# Patient Record
Sex: Female | Born: 1980 | Race: White | Hispanic: No | Marital: Single | Smoking: Former smoker
Health system: Southern US, Community
[De-identification: ages and names within clinical notes are randomized; demographics above are authoritative.]

## PROBLEM LIST (undated history)

## (undated) DIAGNOSIS — K529 Noninfective gastroenteritis and colitis, unspecified: Secondary | ICD-10-CM

## (undated) DIAGNOSIS — N1330 Unspecified hydronephrosis: Secondary | ICD-10-CM

## (undated) DIAGNOSIS — K219 Gastro-esophageal reflux disease without esophagitis: Secondary | ICD-10-CM

## (undated) DIAGNOSIS — J45909 Unspecified asthma, uncomplicated: Secondary | ICD-10-CM

## (undated) DIAGNOSIS — N289 Disorder of kidney and ureter, unspecified: Secondary | ICD-10-CM

## (undated) DIAGNOSIS — E876 Hypokalemia: Secondary | ICD-10-CM

## (undated) DIAGNOSIS — K2970 Gastritis, unspecified, without bleeding: Secondary | ICD-10-CM

## (undated) DIAGNOSIS — N883 Incompetence of cervix uteri: Secondary | ICD-10-CM

## (undated) DIAGNOSIS — J310 Chronic rhinitis: Secondary | ICD-10-CM

## (undated) HISTORY — PX: CERVICAL CERCLAGE: SHX1329

## (undated) HISTORY — PX: OTHER SURGICAL HISTORY: SHX169

## (undated) HISTORY — PX: TUBAL LIGATION: SHX77

---

## 2010-01-25 ENCOUNTER — Inpatient Hospital Stay (HOSPITAL_COMMUNITY): Admission: AD | Admit: 2010-01-25 | Discharge: 2010-01-25 | Payer: Self-pay | Admitting: Family Medicine

## 2010-02-06 ENCOUNTER — Ambulatory Visit: Payer: Self-pay | Admitting: Obstetrics and Gynecology

## 2010-02-06 ENCOUNTER — Inpatient Hospital Stay (HOSPITAL_COMMUNITY): Admission: AD | Admit: 2010-02-06 | Discharge: 2010-02-06 | Payer: Self-pay | Admitting: Obstetrics & Gynecology

## 2010-05-20 ENCOUNTER — Emergency Department (HOSPITAL_COMMUNITY): Admission: EM | Admit: 2010-05-20 | Discharge: 2010-05-20 | Payer: Self-pay | Admitting: Emergency Medicine

## 2010-10-03 ENCOUNTER — Emergency Department (HOSPITAL_COMMUNITY)
Admission: EM | Admit: 2010-10-03 | Discharge: 2010-10-03 | Disposition: A | Discharge status: Home / Self Care | Payer: Medicaid Other | Attending: Emergency Medicine | Admitting: Emergency Medicine

## 2010-10-03 DIAGNOSIS — K029 Dental caries, unspecified: Secondary | ICD-10-CM | POA: Insufficient documentation

## 2010-10-03 DIAGNOSIS — K089 Disorder of teeth and supporting structures, unspecified: Secondary | ICD-10-CM | POA: Insufficient documentation

## 2010-10-03 DIAGNOSIS — K219 Gastro-esophageal reflux disease without esophagitis: Secondary | ICD-10-CM | POA: Insufficient documentation

## 2010-10-03 DIAGNOSIS — J45909 Unspecified asthma, uncomplicated: Secondary | ICD-10-CM | POA: Insufficient documentation

## 2010-11-11 LAB — URINALYSIS, ROUTINE W REFLEX MICROSCOPIC
Bilirubin Urine: NEGATIVE
Ketones, ur: NEGATIVE mg/dL
Nitrite: NEGATIVE
Specific Gravity, Urine: 1.007 (ref 1.005–1.030)
Urobilinogen, UA: 0.2 mg/dL (ref 0.0–1.0)

## 2010-11-11 LAB — COMPREHENSIVE METABOLIC PANEL
ALT: 16 U/L (ref 0–35)
Albumin: 4.3 g/dL (ref 3.5–5.2)
BUN: 2 mg/dL — ABNORMAL LOW (ref 6–23)
Calcium: 9.5 mg/dL (ref 8.4–10.5)
Chloride: 107 mEq/L (ref 96–112)
GFR calc Af Amer: >60 mL/min (ref >60)
GFR calc non Af Amer: >60 mL/min (ref >60)
Glucose, Bld: 89 mg/dL (ref 70–99)
Potassium: 3.7 mEq/L (ref 3.5–5.1)
Total Bilirubin: 0.6 mg/dL (ref 0.3–1.2)
Total Protein: 6.6 g/dL (ref 6.0–8.3)

## 2010-11-11 LAB — CBC
Hemoglobin: 11.6 g/dL — ABNORMAL LOW (ref 12.0–15.0)
MCHC: 33.7 g/dL (ref 30.0–36.0)
Platelets: 260 10*3/uL (ref 150–400)
RBC: 4.02 MIL/uL (ref 3.87–5.11)
RDW: 13.2 % (ref 11.5–15.5)

## 2010-11-11 LAB — DIFFERENTIAL
Eosinophils Relative: 1 % (ref 0–5)
Lymphocytes Relative: 12 % (ref 12–46)
Lymphs Abs: 0.9 10*3/uL (ref 0.7–4.0)
Monocytes Absolute: 0.3 10*3/uL (ref 0.1–1.0)
Monocytes Relative: 5 % (ref 3–12)
Neutrophils Relative %: 82 % — ABNORMAL HIGH (ref 43–77)

## 2010-11-11 LAB — URINE CULTURE
Colony Count: NO GROWTH
Culture  Setup Time: 201109221235
Culture: NO GROWTH

## 2010-11-11 LAB — URINE MICROSCOPIC-ADD ON

## 2010-11-15 LAB — URINALYSIS, ROUTINE W REFLEX MICROSCOPIC
Bilirubin Urine: NEGATIVE
Glucose, UA: NEGATIVE mg/dL
Ketones, ur: NEGATIVE mg/dL
Nitrite: NEGATIVE
Nitrite: NEGATIVE
Protein, ur: 100 mg/dL — AB
Specific Gravity, Urine: <1.005 — ABNORMAL LOW (ref 1.005–1.030)
Specific Gravity, Urine: 1.01 (ref 1.005–1.030)
Urobilinogen, UA: 0.2 mg/dL (ref 0.0–1.0)
pH: 6.5 (ref 5.0–8.0)
pH: 7 (ref 5.0–8.0)

## 2010-11-15 LAB — URINE CULTURE

## 2010-11-15 LAB — DIFFERENTIAL
Eosinophils Relative: 1 % (ref 0–5)
Monocytes Absolute: 0.6 10*3/uL (ref 0.1–1.0)
Neutro Abs: 9 10*3/uL — ABNORMAL HIGH (ref 1.7–7.7)

## 2010-11-15 LAB — URINE MICROSCOPIC-ADD ON

## 2010-11-15 LAB — WET PREP, GENITAL
Clue Cells Wet Prep HPF POC: NONE SEEN
Trich, Wet Prep: NONE SEEN
Trich, Wet Prep: NONE SEEN
Yeast Wet Prep HPF POC: NONE SEEN

## 2010-11-15 LAB — POCT PREGNANCY, URINE: Preg Test, Ur: NEGATIVE

## 2010-11-15 LAB — GC/CHLAMYDIA PROBE AMP, GENITAL: Chlamydia, DNA Probe: NEGATIVE

## 2010-11-15 LAB — CBC
MCHC: 35.2 g/dL (ref 30.0–36.0)
Platelets: 188 10*3/uL (ref 150–400)

## 2010-11-22 ENCOUNTER — Emergency Department (HOSPITAL_COMMUNITY)
Admission: EM | Admit: 2010-11-22 | Discharge: 2010-11-22 | Disposition: A | Discharge status: Home / Self Care | Payer: Self-pay | Attending: Emergency Medicine | Admitting: Emergency Medicine

## 2010-11-22 DIAGNOSIS — J309 Allergic rhinitis, unspecified: Secondary | ICD-10-CM | POA: Insufficient documentation

## 2010-11-22 DIAGNOSIS — R05 Cough: Secondary | ICD-10-CM | POA: Insufficient documentation

## 2010-11-22 DIAGNOSIS — I10 Essential (primary) hypertension: Secondary | ICD-10-CM | POA: Insufficient documentation

## 2010-11-22 DIAGNOSIS — J45909 Unspecified asthma, uncomplicated: Secondary | ICD-10-CM | POA: Insufficient documentation

## 2010-11-22 DIAGNOSIS — J3489 Other specified disorders of nose and nasal sinuses: Secondary | ICD-10-CM | POA: Insufficient documentation

## 2010-11-22 DIAGNOSIS — Z85118 Personal history of other malignant neoplasm of bronchus and lung: Secondary | ICD-10-CM | POA: Insufficient documentation

## 2010-11-22 DIAGNOSIS — R059 Cough, unspecified: Secondary | ICD-10-CM | POA: Insufficient documentation

## 2010-11-22 DIAGNOSIS — K219 Gastro-esophageal reflux disease without esophagitis: Secondary | ICD-10-CM | POA: Insufficient documentation

## 2011-01-28 ENCOUNTER — Emergency Department (HOSPITAL_COMMUNITY)
Admission: EM | Admit: 2011-01-28 | Discharge: 2011-01-28 | Disposition: A | Discharge status: Home / Self Care | Payer: Self-pay | Attending: Emergency Medicine | Admitting: Emergency Medicine

## 2011-01-28 DIAGNOSIS — J45909 Unspecified asthma, uncomplicated: Secondary | ICD-10-CM | POA: Insufficient documentation

## 2011-01-28 DIAGNOSIS — J029 Acute pharyngitis, unspecified: Secondary | ICD-10-CM | POA: Insufficient documentation

## 2011-01-28 DIAGNOSIS — K219 Gastro-esophageal reflux disease without esophagitis: Secondary | ICD-10-CM | POA: Insufficient documentation

## 2011-01-28 LAB — RAPID STREP SCREEN (MED CTR MEBANE ONLY): Streptococcus, Group A Screen (Direct): NEGATIVE

## 2011-05-09 ENCOUNTER — Inpatient Hospital Stay (INDEPENDENT_AMBULATORY_CARE_PROVIDER_SITE_OTHER)
Admission: RE | Admit: 2011-05-09 | Discharge: 2011-05-09 | Disposition: A | Discharge status: Home / Self Care | Payer: Self-pay | Source: Ambulatory Visit | Attending: Emergency Medicine | Admitting: Emergency Medicine

## 2011-05-09 DIAGNOSIS — K5289 Other specified noninfective gastroenteritis and colitis: Secondary | ICD-10-CM

## 2011-05-09 LAB — POCT I-STAT, CHEM 8
Calcium, Ion: 1.19 mmol/L (ref 1.12–1.32)
Chloride: 107 mEq/L (ref 96–112)
Creatinine, Ser: 0.6 mg/dL (ref 0.50–1.10)
Glucose, Bld: 98 mg/dL (ref 70–99)
Potassium: 4 mEq/L (ref 3.5–5.1)

## 2011-05-09 LAB — POCT URINALYSIS DIP (DEVICE)
Ketones, ur: NEGATIVE mg/dL
Protein, ur: NEGATIVE mg/dL
Specific Gravity, Urine: 1.02 (ref 1.005–1.030)
pH: 6 (ref 5.0–8.0)

## 2011-05-09 LAB — POCT PREGNANCY, URINE: Preg Test, Ur: NEGATIVE

## 2011-05-13 ENCOUNTER — Inpatient Hospital Stay (INDEPENDENT_AMBULATORY_CARE_PROVIDER_SITE_OTHER)
Admission: RE | Admit: 2011-05-13 | Discharge: 2011-05-13 | Disposition: A | Discharge status: Home / Self Care | Payer: Self-pay | Source: Ambulatory Visit | Attending: Family Medicine | Admitting: Family Medicine

## 2011-05-13 DIAGNOSIS — R197 Diarrhea, unspecified: Secondary | ICD-10-CM

## 2011-05-13 LAB — POCT I-STAT, CHEM 8
BUN: <3 mg/dL — ABNORMAL LOW (ref 6–23)
Calcium, Ion: 1.2 mmol/L (ref 1.12–1.32)
Chloride: 104 mEq/L (ref 96–112)
Creatinine, Ser: 0.6 mg/dL (ref 0.50–1.10)
Glucose, Bld: 97 mg/dL (ref 70–99)

## 2011-06-03 ENCOUNTER — Inpatient Hospital Stay (INDEPENDENT_AMBULATORY_CARE_PROVIDER_SITE_OTHER)
Admission: RE | Admit: 2011-06-03 | Discharge: 2011-06-03 | Disposition: A | Discharge status: Home / Self Care | Payer: Self-pay | Source: Ambulatory Visit | Attending: Family Medicine | Admitting: Family Medicine

## 2011-06-03 DIAGNOSIS — J029 Acute pharyngitis, unspecified: Secondary | ICD-10-CM

## 2011-06-03 DIAGNOSIS — J069 Acute upper respiratory infection, unspecified: Secondary | ICD-10-CM

## 2011-10-18 ENCOUNTER — Encounter (HOSPITAL_COMMUNITY): Payer: Self-pay | Admitting: *Deleted

## 2011-10-18 ENCOUNTER — Emergency Department (INDEPENDENT_AMBULATORY_CARE_PROVIDER_SITE_OTHER)
Admission: EM | Admit: 2011-10-18 | Discharge: 2011-10-18 | Disposition: A | Discharge status: Home / Self Care | Payer: Self-pay | Source: Home / Self Care | Attending: Family Medicine | Admitting: Family Medicine

## 2011-10-18 DIAGNOSIS — J310 Chronic rhinitis: Secondary | ICD-10-CM

## 2011-10-18 MED ORDER — ALBUTEROL SULFATE HFA 108 (90 BASE) MCG/ACT IN AERS: 2.0000 | INHALATION_SPRAY | RESPIRATORY_TRACT | Status: DC | PRN

## 2011-10-18 NOTE — ED Notes (Signed)
pT  HAS  COMPLAINTS  OF  ABOUT 1  WEEK  OF  COUGH  /  CONGESTION   WITH  STUFFY  NOSE    AND   SINUS  DRAINAGE  SYMPTOMS  X    1  WEEK    SHE  IS  AWAKE  AS  WELL AS  ALERT AND  ORIENTED  SPEAKING IN  COMPLETE  SENTANCES

## 2011-10-18 NOTE — ED Provider Notes (Signed)
History     CSN: [id number]  Arrival date & time 10/18/11  1509   First MD Initiated Contact with Patient 10/18/11 1624      Chief Complaint  Patient presents with  . Cough    (Consider location/radiation/quality/duration/timing/severity/associated sxs/prior treatment) HPI Comments: Mala presents for evaluation of nasal congestion, rhinorrhea, and cough over the last week. She denies any fever. She's been using her albuterol inhaler without much relief. She reports that she did have a sore throat that is now resolved.  Patient is a 31 y.o. female presenting with URI. The history is provided by the patient.  URI The primary symptoms include sore throat and cough. Primary symptoms do not include fever or wheezing. The current episode started more than 1 week ago. This is a new problem. The problem has been gradually improving.  The sore throat began more than 2 days ago. The sore throat has been resolved since its onset. The sore throat is mild in intensity. The sore throat is not accompanied by trouble swallowing.  Symptoms associated with the illness include sinus pressure, congestion and rhinorrhea. The following treatments were addressed: Acetaminophen was ineffective. A decongestant was ineffective.    History reviewed. No pertinent past medical history.  Past Surgical History  Procedure Date  . Btl     No family history on file.  History  Substance Use Topics  . Smoking status: Not on file  . Smokeless tobacco: Not on file  . Alcohol Use:     OB History    Grav Para Term Preterm Abortions TAB SAB Ect Mult Living                  Review of Systems  Constitutional: Negative.  Negative for fever.  HENT: Positive for congestion, sore throat, rhinorrhea and sinus pressure. Negative for trouble swallowing.   Eyes: Negative.   Respiratory: Positive for cough. Negative for wheezing.   Cardiovascular: Negative.   Gastrointestinal: Negative.   Genitourinary: Negative.     Musculoskeletal: Negative.   Skin: Negative.   Neurological: Negative.     Allergies  Review of patient's allergies indicates no known allergies.  Home Medications   Current Outpatient Rx  Name Route Sig Dispense Refill  . ALBUTEROL SULFATE HFA 108 (90 BASE) MCG/ACT IN AERS Inhalation Inhale 2 puffs into the lungs every 4 (four) hours as needed for wheezing or shortness of breath. 1 Inhaler 0    BP 132/64  Pulse 94  Temp(Src) 98.7 F (37.1 C) (Oral)  Resp 16  SpO2 100%  LMP 09/04/2011  Physical Exam  Nursing note and vitals reviewed. Constitutional: She is oriented to person, place, and time. She appears well-developed and well-nourished.  HENT:  Head: Normocephalic and atraumatic.  Right Ear: Tympanic membrane is retracted.  Left Ear: Tympanic membrane is retracted.  Mouth/Throat: Uvula is midline, oropharynx is clear and moist and mucous membranes are normal.  Eyes: EOM are normal.  Neck: Normal range of motion.  Pulmonary/Chest: Effort normal and breath sounds normal. She has no decreased breath sounds. She has no wheezes. She has no rhonchi.  Musculoskeletal: Normal range of motion.  Neurological: She is alert and oriented to person, place, and time.  Skin: Skin is warm and dry.  Psychiatric: Her behavior is normal.    ED Course  Procedures (including critical care time)  Labs Reviewed - No data to display No results found.   1. Rhinitis       MDM  Continue supportive care;  refilled albuterol inhaler        Richardo Priest, MD 10/18/11 1647

## 2011-10-18 NOTE — Discharge Instructions (Signed)
I recommend aggressive fever control with acetaminophen (Tylenol) and/or ibuprofen. You may use these together, alternating them every 4 hours, or individually, every 8 hours. For example, take acetaminophen 500 to 1000 mg at 12 noon, then 600 to 800 mg of ibuprofen at 4 pm, then acetaminophen at 8 pm, etc. Also, stay hydrated with clear liquids. Use your albuterol as needed and as directed. Return to care should your symptoms not improve, or worsen in any way.

## 2012-05-18 ENCOUNTER — Emergency Department (HOSPITAL_COMMUNITY)
Admission: EM | Admit: 2012-05-18 | Discharge: 2012-05-18 | Disposition: A | Discharge status: Home / Self Care | Payer: Self-pay | Attending: Emergency Medicine | Admitting: Emergency Medicine

## 2012-05-18 ENCOUNTER — Encounter (HOSPITAL_COMMUNITY): Payer: Self-pay

## 2012-05-18 DIAGNOSIS — J029 Acute pharyngitis, unspecified: Secondary | ICD-10-CM | POA: Insufficient documentation

## 2012-05-18 HISTORY — DX: Disorder of kidney and ureter, unspecified: N28.9

## 2012-05-18 HISTORY — DX: Unspecified hydronephrosis: N13.30

## 2012-05-18 HISTORY — DX: Incompetence of cervix uteri: N88.3

## 2012-05-18 LAB — RAPID STREP SCREEN (MED CTR MEBANE ONLY): Streptococcus, Group A Screen (Direct): NEGATIVE

## 2012-05-18 NOTE — ED Provider Notes (Signed)
History     CSN: [id number]  Arrival date & time 05/18/12  0804   First MD Initiated Contact with Patient 05/18/12 [phone]      Chief Complaint  Patient presents with  . Sore Throat    (Consider location/radiation/quality/duration/timing/severity/associated sxs/prior treatment) HPI  31 year old female presents complaining of sore throat. Pt reports for the past several weeks she has been experiencing throat soreness and irritation.  Sxs has been persistent, worse at night.  Does complain of increase irritating with swallowing but able to eat and drink.  Has been having dental pain and was seen by a dentist.  Sts she was placed on penicillin which she has been taking for the past 2 weeks while awaiting for dental extraction next week.  Pt denies fever, chills, ear pain, cp, sob.  Has moved from [place] to here and has been having seasonal allergy with sneezing and itchy eyes, but currently taking claritin for it.  No recent trauma, no abd pain, no rash.    History reviewed. No pertinent past medical history.  Past Surgical History  Procedure Date  . Btl     No family history on file.  History  Substance Use Topics  . Smoking status: Not on file  . Smokeless tobacco: Not on file  . Alcohol Use:     OB History    Grav Para Term Preterm Abortions TAB SAB Ect Mult Living                  Review of Systems  All other systems reviewed and are negative.    Allergies  Review of patient's allergies indicates no known allergies.  Home Medications   Current Outpatient Rx  Name Route Sig Dispense Refill  . ALBUTEROL SULFATE HFA 108 (90 BASE) MCG/ACT IN AERS Inhalation Inhale 2 puffs into the lungs every 4 (four) hours as needed for wheezing or shortness of breath. 1 Inhaler 0    BP 148/94  Pulse 99  Temp 97.3 F (36.3 C) (Oral)  Resp 20  SpO2 100%  Physical Exam  Nursing note and vitals reviewed. Constitutional: She appears well-developed and well-nourished. No  distress.  HENT:  Head: Atraumatic.  Right Ear: External ear normal.  Left Ear: External ear normal.  Nose: Nose normal.  Mouth/Throat: Oropharynx is clear and moist. No oropharyngeal exudate.       No tonsilar exudates or tonsilar enlargement noted.  Uvula midline.  No evidence of PTA or Ludwig's angina.  No voice changes, no trismus, no drooling.    Poor dentition throughout without evidence of visible abscess  Eyes: Conjunctivae normal are normal.  Neck: Normal range of motion. Neck supple.  Cardiovascular: Normal rate and regular rhythm.   Pulmonary/Chest: Effort normal and breath sounds normal. No respiratory distress. She has no wheezes. She exhibits no tenderness.  Abdominal: Bowel sounds are normal.  Lymphadenopathy:    She has no cervical adenopathy.  Neurological: She is alert.  Skin: Skin is warm.    ED Course  Procedures (including critical care time)   Labs Reviewed  RAPID STREP SCREEN   Results for orders placed during the hospital encounter of 05/18/12  RAPID STREP SCREEN      Component Value Range   Streptococcus, Group A Screen (Direct) NEGATIVE  NEGATIVE   No results found.  1. Sore throat  MDM  Pt with sore throat x 3 weeks.  No voice changes, no air way compromise, able to eat and drink, exam was remarkable only  for poor dentition and pt currently taking PCN for it.  She is scheduled to have dental appointment next week.  Will perform rapid strep test for further evaluation.  She is afebrile, nontoxic.      9:25 AM Strep test neg.  Pt able to tolerates PO.  Recommend f/u with dentist, and continue gargle with salt water.  Throat irritation can also be related to allergy. Pt will continue OTC allergy medication.  Nursing notes reviewed and considered in documentation  Previous records reviewed and considered   BP 148/94  Pulse 99  Temp 97.3 F (36.3 C) (Oral)  Resp 20  SpO2 100%  LMP 04/17/2012     Fayrene Helper, PA-C 05/18/12 [phone]

## 2012-05-18 NOTE — ED Provider Notes (Signed)
Medical screening examination/treatment/procedure(s) were performed by non-physician practitioner and as supervising physician I was immediately available for consultation/collaboration.   Ellana Kawa, MD 05/18/12 1538 

## 2012-05-18 NOTE — ED Notes (Addendum)
Pt c/o sore throat with puss pockets at the back of her throat for several weeks and problems swallowing. Pt states she has tried as many OTC remedies as possible. Pt is suppose to go to the dentist for a procedure Monday morning and is worried she won't be able to breathe for the procedure.  No fever to her knowledge. Has been taking allergy medicine.

## 2012-05-18 NOTE — ED Notes (Signed)
[place], PA at the bedside 

## 2012-06-28 IMAGING — CR DG CHEST 2V
2 series · 2 of 2 positions shown · non-contrast
Comparison: None.

CLINICAL DATA: Cough 2 weeks

CHEST - 2 VIEW

[w chest pa]
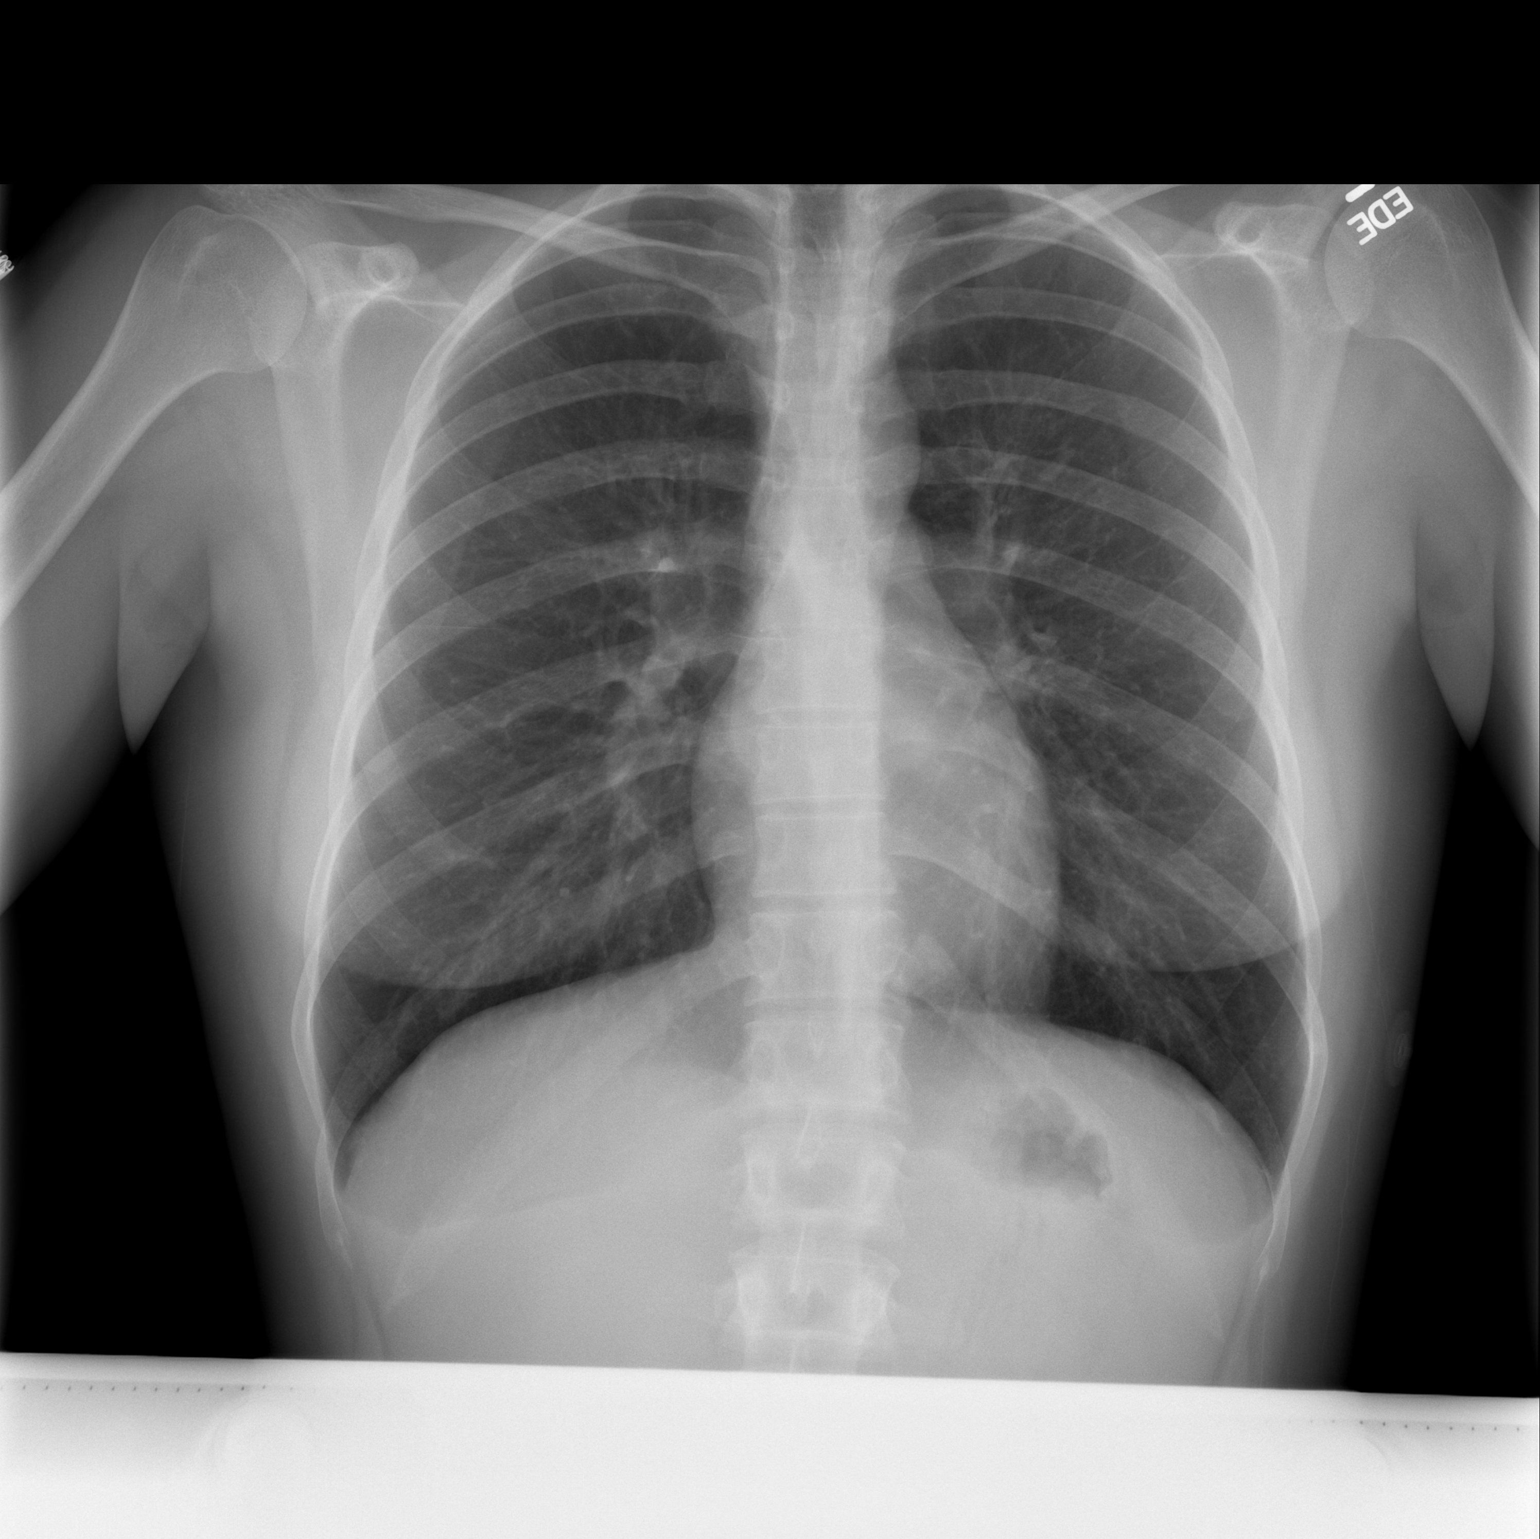

[w chest lat]
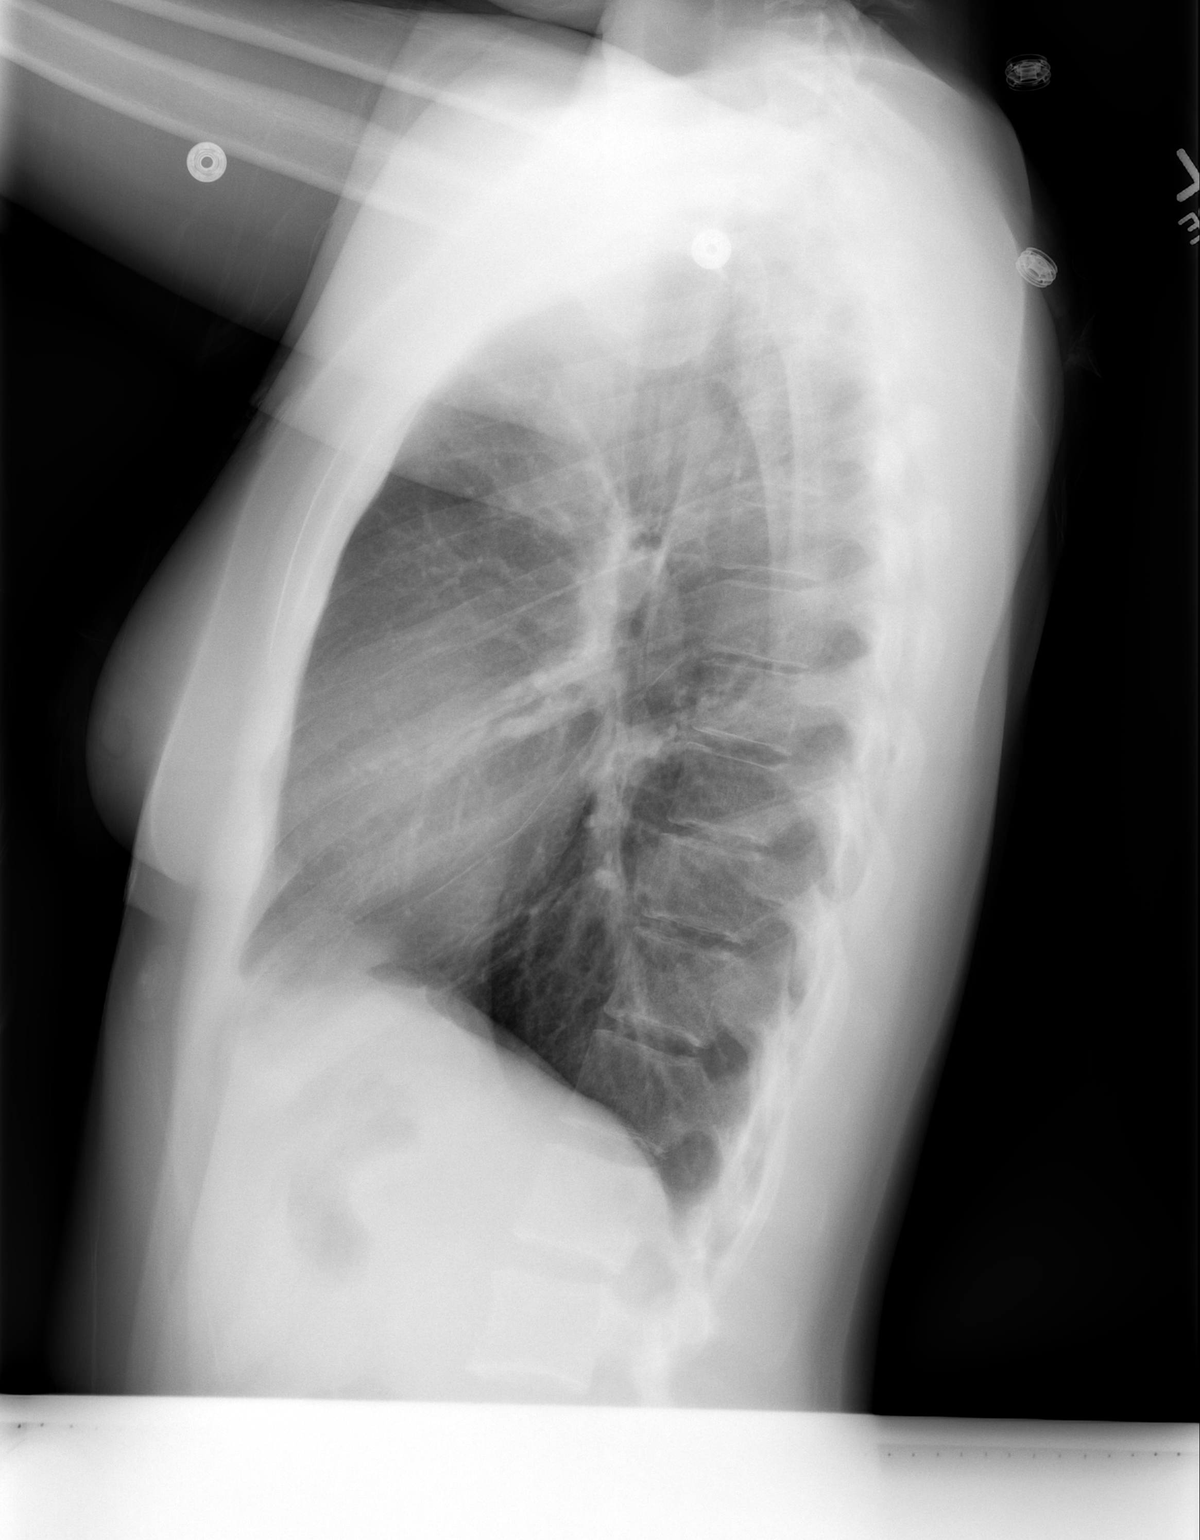

[2 of 2 positions shown; findings below may reference images not displayed]

FINDINGS: Normal mediastinum and cardiac silhouette.  Lungs are
clear.  No effusion, infiltrate, or pneumothorax.  No bony
abnormality.
IMPRESSION: Normal chest radiograph.

## 2012-11-29 ENCOUNTER — Encounter (HOSPITAL_COMMUNITY): Payer: Self-pay | Admitting: Emergency Medicine

## 2012-11-29 ENCOUNTER — Emergency Department (HOSPITAL_COMMUNITY)
Admission: EM | Admit: 2012-11-29 | Discharge: 2012-11-30 | Disposition: A | Discharge status: Home / Self Care | Payer: Self-pay | Attending: Emergency Medicine | Admitting: Emergency Medicine

## 2012-11-29 DIAGNOSIS — Z8742 Personal history of other diseases of the female genital tract: Secondary | ICD-10-CM | POA: Insufficient documentation

## 2012-11-29 DIAGNOSIS — J45909 Unspecified asthma, uncomplicated: Secondary | ICD-10-CM | POA: Insufficient documentation

## 2012-11-29 DIAGNOSIS — K5289 Other specified noninfective gastroenteritis and colitis: Secondary | ICD-10-CM | POA: Insufficient documentation

## 2012-11-29 DIAGNOSIS — R1012 Left upper quadrant pain: Secondary | ICD-10-CM | POA: Insufficient documentation

## 2012-11-29 DIAGNOSIS — K529 Noninfective gastroenteritis and colitis, unspecified: Secondary | ICD-10-CM

## 2012-11-29 DIAGNOSIS — R110 Nausea: Secondary | ICD-10-CM | POA: Insufficient documentation

## 2012-11-29 DIAGNOSIS — Z862 Personal history of diseases of the blood and blood-forming organs and certain disorders involving the immune mechanism: Secondary | ICD-10-CM | POA: Insufficient documentation

## 2012-11-29 DIAGNOSIS — K921 Melena: Secondary | ICD-10-CM | POA: Insufficient documentation

## 2012-11-29 DIAGNOSIS — Z8639 Personal history of other endocrine, nutritional and metabolic disease: Secondary | ICD-10-CM | POA: Insufficient documentation

## 2012-11-29 DIAGNOSIS — Z87891 Personal history of nicotine dependence: Secondary | ICD-10-CM | POA: Insufficient documentation

## 2012-11-29 DIAGNOSIS — Z87448 Personal history of other diseases of urinary system: Secondary | ICD-10-CM | POA: Insufficient documentation

## 2012-11-29 DIAGNOSIS — Z9851 Tubal ligation status: Secondary | ICD-10-CM | POA: Insufficient documentation

## 2012-11-29 DIAGNOSIS — Z3202 Encounter for pregnancy test, result negative: Secondary | ICD-10-CM | POA: Insufficient documentation

## 2012-11-29 HISTORY — DX: Unspecified asthma, uncomplicated: J45.909

## 2012-11-29 HISTORY — DX: Hypokalemia: E87.6

## 2012-11-29 LAB — COMPREHENSIVE METABOLIC PANEL
Alkaline Phosphatase: 129 U/L — ABNORMAL HIGH (ref 39–117)
BUN: 9 mg/dL (ref 6–23)
Chloride: 103 mEq/L (ref 96–112)
GFR calc Af Amer: >90 mL/min (ref >90)
GFR calc non Af Amer: >90 mL/min (ref >90)
Glucose, Bld: 102 mg/dL — ABNORMAL HIGH (ref 70–99)
Potassium: 4.1 mEq/L (ref 3.5–5.1)
Total Bilirubin: 0.3 mg/dL (ref 0.3–1.2)

## 2012-11-29 LAB — CBC WITH DIFFERENTIAL/PLATELET
Eosinophils Absolute: 0.1 10*3/uL (ref 0.0–0.7)
Hemoglobin: 11.7 g/dL — ABNORMAL LOW (ref 12.0–15.0)
Lymphs Abs: 1.4 10*3/uL (ref 0.7–4.0)
MCH: 28.7 pg (ref 26.0–34.0)
Monocytes Relative: 5 % (ref 3–12)
Neutro Abs: 5.2 10*3/uL (ref 1.7–7.7)
Neutrophils Relative %: 73 % (ref 43–77)
RBC: 4.08 MIL/uL (ref 3.87–5.11)

## 2012-11-29 LAB — URINALYSIS, ROUTINE W REFLEX MICROSCOPIC
Bilirubin Urine: NEGATIVE
Glucose, UA: NEGATIVE mg/dL
Nitrite: NEGATIVE
Specific Gravity, Urine: 1.016 (ref 1.005–1.030)
pH: 6.5 (ref 5.0–8.0)

## 2012-11-29 LAB — POCT PREGNANCY, URINE: Preg Test, Ur: NEGATIVE

## 2012-11-29 MED ORDER — GI COCKTAIL ~~LOC~~
30.0000 mL | Freq: Once | ORAL | Status: AC
  Administered 2012-11-29: 30 mL via ORAL
  Filled 2012-11-29: qty 30

## 2012-11-29 NOTE — ED Notes (Signed)
PT. REPORTS LUQ PAIN WITH BLOODY DIARRHEA AND NAUSEA ONSET THIS EVENING . DENIES FEVER OR VAGINAL DISCHARGE.

## 2012-11-30 MED ORDER — METRONIDAZOLE 500 MG PO TABS: 500.0000 mg | ORAL_TABLET | Freq: Two times a day (BID) | ORAL | Status: DC

## 2012-11-30 MED ORDER — CIPROFLOXACIN HCL 500 MG PO TABS: 500.0000 mg | ORAL_TABLET | Freq: Two times a day (BID) | ORAL | Status: DC

## 2012-11-30 MED ORDER — OXYCODONE-ACETAMINOPHEN 5-325 MG PO TABS: 1.0000 | ORAL_TABLET | ORAL | Status: DC | PRN

## 2012-11-30 MED ORDER — METRONIDAZOLE 500 MG PO TABS
500.0000 mg | ORAL_TABLET | Freq: Once | ORAL | Status: AC
  Administered 2012-11-30: 500 mg via ORAL
  Filled 2012-11-30: qty 1

## 2012-11-30 MED ORDER — CIPROFLOXACIN HCL 500 MG PO TABS
500.0000 mg | ORAL_TABLET | Freq: Once | ORAL | Status: AC
  Administered 2012-11-30: 500 mg via ORAL
  Filled 2012-11-30: qty 1

## 2012-11-30 NOTE — ED Notes (Signed)
The patient is AOx4 and comfortable with the discharge instructions. 

## 2012-11-30 NOTE — ED Provider Notes (Signed)
History     CSN: [id number]  Arrival date & time 11/29/12  2123   First MD Initiated Contact with Patient 11/30/12 0009      Chief Complaint  Patient presents with  . Diarrhea     Patient is a 32 y.o. female presenting with diarrhea. The history is provided by the patient.  Diarrhea Quality:  Bloody Severity:  Moderate Onset quality:  Gradual Duration: several hrs ago. Timing:  Intermittent Progression:  Improving Relieved by:  Nothing Worsened by:  Nothing tried Associated symptoms: abdominal pain   Associated symptoms: no fever and no vomiting    Pt reports she had been feeling well, then several hrs ago she started to have LUQ pain.  Soon after she developed nonbloody diarrhea.  This resolved and then she had several more episodes of diarrhea that was bloody (not black) No vomiting.  No h/o rectal bleeding previously.  No h/o inflammatory bowel disease No sick contacts No fever reported  Past Medical History  Diagnosis Date  . Renal disorder   . Hydronephrosis   . Incompetent cervix   . Asthma   . Hypokalemia     Past Surgical History  Procedure Laterality Date  . Btl    . Tubal ligation    . Cesarean section      No family history on file.  History  Substance Use Topics  . Smoking status: Former Smoker    Quit date: 05/18/2008  . Smokeless tobacco: Not on file  . Alcohol Use: Yes     Comment: occasional    OB History   Grav Para Term Preterm Abortions TAB SAB Ect Mult Living                  Review of Systems  Constitutional: Negative for fever.  Respiratory: Negative for cough.   Cardiovascular: Negative for chest pain.  Gastrointestinal: Positive for abdominal pain, diarrhea and blood in stool. Negative for vomiting.  Genitourinary: Negative for dysuria and vaginal bleeding.  Neurological: Negative for weakness.  All other systems reviewed and are negative.    Allergies  Aspirin  Home Medications   Current Outpatient Rx  Name   Route  Sig  Dispense  Refill  . loperamide (IMODIUM) 2 MG capsule   Oral   Take 2 mg by mouth once.         . ciprofloxacin (CIPRO) 500 MG tablet   Oral   Take 1 tablet (500 mg total) by mouth 2 (two) times daily. One po bid x 7 days   14 tablet   0   . metroNIDAZOLE (FLAGYL) 500 MG tablet   Oral   Take 1 tablet (500 mg total) by mouth 2 (two) times daily.   14 tablet   0   . oxyCODONE-acetaminophen (PERCOCET/ROXICET) 5-325 MG per tablet   Oral   Take 1 tablet by mouth every 4 (four) hours as needed for pain.   15 tablet   0     BP 131/74  Pulse 77  Temp(Src) 98.4 F (36.9 C) (Oral)  Resp 18  Ht 5' 1.5" (1.562 m)  Wt 137 lb (62.143 kg)  BMI 25.47 kg/m2  SpO2 100%  LMP 11/19/2012  Physical Exam CONSTITUTIONAL: Well developed/well nourished HEAD: Normocephalic/atraumatic EYES: EOMI/PERRL, conjunctiva pink ENMT: Mucous membranes moist NECK: supple no meningeal signs SPINE:entire spine nontender CV: S1/S2 noted, no murmurs/rubs/gallops noted LUNGS: Lungs are clear to auscultation bilaterally, no apparent distress ABDOMEN: soft, nontender, no rebound or guarding. +BS GU:no  cva tenderness Rectal - stool color normal.  Hemoccult negative.  Chaperone present NEURO: Pt is awake/alert, moves all extremitiesx4 EXTREMITIES: pulses normal, full ROM SKIN: warm, color normal, no rash noted PSYCH: no abnormalities of mood noted  ED Course  Procedures (including critical care time)  Labs Reviewed  CBC WITH DIFFERENTIAL - Abnormal; Notable for the following:    Hemoglobin 11.7 (*)    HCT 33.8 (*)    All other components within normal limits  COMPREHENSIVE METABOLIC PANEL - Abnormal; Notable for the following:    Glucose, Bld 102 (*)    Alkaline Phosphatase 129 (*)    All other components within normal limits  URINALYSIS, ROUTINE W REFLEX MICROSCOPIC  POCT PREGNANCY, URINE  OCCULT BLOOD, POC DEVICE     1. Colitis       MDM  Nursing notes including past  medical history and social history reviewed and considered in documentation Labs/vital reviewed and considered   Abdominal exam benign.  Given she had bloody stool earlier, will treat for potential bacterial source.  Cipro/flagyll started.  Pt otherwise well, nontoxic and not septic appearing Stable for d/c        Joya Gaskins, MD 11/30/12 0121

## 2013-06-27 ENCOUNTER — Emergency Department (HOSPITAL_COMMUNITY)
Admission: EM | Admit: 2013-06-27 | Discharge: 2013-06-27 | Disposition: A | Discharge status: Home / Self Care | Payer: Medicaid Other | Attending: Emergency Medicine | Admitting: Emergency Medicine

## 2013-06-27 ENCOUNTER — Encounter (HOSPITAL_COMMUNITY): Payer: Self-pay | Admitting: Emergency Medicine

## 2013-06-27 DIAGNOSIS — Z87891 Personal history of nicotine dependence: Secondary | ICD-10-CM | POA: Insufficient documentation

## 2013-06-27 DIAGNOSIS — K2970 Gastritis, unspecified, without bleeding: Secondary | ICD-10-CM

## 2013-06-27 DIAGNOSIS — Z3202 Encounter for pregnancy test, result negative: Secondary | ICD-10-CM | POA: Insufficient documentation

## 2013-06-27 DIAGNOSIS — Z862 Personal history of diseases of the blood and blood-forming organs and certain disorders involving the immune mechanism: Secondary | ICD-10-CM | POA: Insufficient documentation

## 2013-06-27 DIAGNOSIS — Z8742 Personal history of other diseases of the female genital tract: Secondary | ICD-10-CM | POA: Insufficient documentation

## 2013-06-27 DIAGNOSIS — R079 Chest pain, unspecified: Secondary | ICD-10-CM | POA: Insufficient documentation

## 2013-06-27 DIAGNOSIS — Z8639 Personal history of other endocrine, nutritional and metabolic disease: Secondary | ICD-10-CM | POA: Insufficient documentation

## 2013-06-27 DIAGNOSIS — J45909 Unspecified asthma, uncomplicated: Secondary | ICD-10-CM | POA: Insufficient documentation

## 2013-06-27 LAB — COMPREHENSIVE METABOLIC PANEL
ALT: 29 U/L (ref 0–35)
Calcium: 9.4 mg/dL (ref 8.4–10.5)
Chloride: 103 mEq/L (ref 96–112)
GFR calc Af Amer: >90 mL/min (ref >90)
GFR calc non Af Amer: >90 mL/min (ref >90)
Glucose, Bld: 91 mg/dL (ref 70–99)
Potassium: 3.8 mEq/L (ref 3.5–5.1)
Sodium: 139 mEq/L (ref 135–145)
Total Protein: 7.4 g/dL (ref 6.0–8.3)

## 2013-06-27 LAB — CBC WITH DIFFERENTIAL/PLATELET
Eosinophils Absolute: 0.1 10*3/uL (ref 0.0–0.7)
Eosinophils Relative: 2 % (ref 0–5)
Lymphs Abs: 1.1 10*3/uL (ref 0.7–4.0)
MCH: 29.7 pg (ref 26.0–34.0)
MCHC: 35.2 g/dL (ref 30.0–36.0)
MCV: 84.3 fL (ref 78.0–100.0)
Neutro Abs: 3.7 10*3/uL (ref 1.7–7.7)
Platelets: 263 10*3/uL (ref 150–400)
RBC: 4.34 MIL/uL (ref 3.87–5.11)
RDW: 13.3 % (ref 11.5–15.5)

## 2013-06-27 LAB — LIPASE, BLOOD: Lipase: 33 U/L (ref 11–59)

## 2013-06-27 MED ORDER — PANTOPRAZOLE SODIUM 20 MG PO TBEC: 20.0000 mg | DELAYED_RELEASE_TABLET | Freq: Every day | ORAL | Status: AC

## 2013-06-27 MED ORDER — PANTOPRAZOLE SODIUM 40 MG IV SOLR
40.0000 mg | Freq: Once | INTRAVENOUS | Status: AC
  Administered 2013-06-27: 40 mg via INTRAVENOUS
  Filled 2013-06-27 (×2): qty 40

## 2013-06-27 MED ORDER — GI COCKTAIL ~~LOC~~
30.0000 mL | Freq: Once | ORAL | Status: AC
  Administered 2013-06-27: 30 mL via ORAL
  Filled 2013-06-27: qty 30

## 2013-06-27 NOTE — ED Notes (Signed)
Pt reports initial relief with GI cocktail, but states lower epigastric pain is now returning. VSS. Pt remains on heart monitor.

## 2013-06-27 NOTE — ED Notes (Signed)
Pt with abscess to L leg and abdominal and chest "burning" since last night.

## 2013-06-27 NOTE — ED Notes (Signed)
Pt reports abscess to interior aspect of left knee x "several days." Reports taking hot baths and "squezzing it a lot." Small dark spot noted, no redness, streaking or s/s infection noted.

## 2013-06-27 NOTE — ED Notes (Signed)
Discharge instructions reviewed with pt. Pt verbalized understanding.   

## 2013-06-27 NOTE — ED Provider Notes (Signed)
CSN: [id number]     Arrival date & time 06/27/13  4098 History   First MD Initiated Contact with Patient 06/27/13 0919     Chief Complaint  Patient presents with  . Leg Pain  . Chest Pain   (Consider location/radiation/quality/duration/timing/severity/associated sxs/prior Treatment) HPI Comments: Patient here with two complaints - first states has had an abscess to left medial portion of the knee.  She is concerned that this may be making her left thigh hurt.  She states that she has squeezed the area and gotten pus from it.  She denies redness, streaking of redness, fever or chills.  She is also here with mild epigastric burning pain with radiation up into her chest.  She reports increase in belching and mild nausea but denies vomiting, diarrhea or constipation.  She reports that she became concerned because her mother thought this may be more serious.  Patient is a 32 y.o. female presenting with leg pain and chest pain. The history is provided by the patient. No language interpreter was used.  Leg Pain Location:  Knee Time since incident:  24 hours Injury: no   Knee location:  R knee Pain details:    Quality:  Dull   Radiates to:  Does not radiate   Severity:  Mild   Onset quality:  Gradual   Timing:  Constant   Progression:  Partially resolved Chronicity:  New Foreign body present:  No foreign bodies Tetanus status:  Unknown Prior injury to area:  No Relieved by:  Nothing Worsened by:  Nothing tried Ineffective treatments:  None tried Associated symptoms: no back pain, no decreased ROM, no fatigue, no fever, no itching, no muscle weakness, no neck pain, no numbness, no stiffness, no swelling and no tingling   Chest Pain Associated symptoms: abdominal pain and nausea   Associated symptoms: no back pain, no fatigue, no fever and not vomiting     Past Medical History  Diagnosis Date  . Renal disorder   . Hydronephrosis   . Incompetent cervix   . Asthma   . Hypokalemia     Past Surgical History  Procedure Laterality Date  . Btl    . Tubal ligation    . Cesarean section     No family history on file. History  Substance Use Topics  . Smoking status: Former Smoker    Quit date: 05/18/2008  . Smokeless tobacco: Not on file  . Alcohol Use: Yes     Comment: occasional   OB History   Grav Para Term Preterm Abortions TAB SAB Ect Mult Living                 Review of Systems  Constitutional: Negative for fever and fatigue.  Cardiovascular: Positive for chest pain.  Gastrointestinal: Positive for nausea and abdominal pain. Negative for vomiting, diarrhea, constipation and abdominal distention.  Musculoskeletal: Negative for back pain, neck pain and stiffness.  Skin: Negative for itching.  All other systems reviewed and are negative.    Allergies  Aspirin  Home Medications   Current Outpatient Rx  Name  Route  Sig  Dispense  Refill  . loratadine (CLARITIN) 10 MG tablet   Oral   Take 10 mg by mouth daily as needed for allergies.          BP 146/86  Pulse 70  Temp(Src) 98.1 F (36.7 C) (Oral)  Resp 12  Ht 5\' 2"  (1.575 m)  Wt 148 lb 4.8 oz (67.268 kg)  BMI 27.12  kg/m2  SpO2 98%  LMP 05/31/2013 Physical Exam  Nursing note and vitals reviewed. Constitutional: She is oriented to person, place, and time. She appears well-developed and well-nourished. No distress.  HENT:  Head: Normocephalic and atraumatic.  Right Ear: External ear normal.  Left Ear: External ear normal.  Nose: Nose normal.  Mouth/Throat: Oropharynx is clear and moist. No oropharyngeal exudate.  Eyes: Conjunctivae are normal. Pupils are equal, round, and reactive to light. No scleral icterus.  Neck: Normal range of motion. Neck supple.  Cardiovascular: Normal rate, regular rhythm and normal heart sounds.  Exam reveals no gallop and no friction rub.   No murmur heard. Pulmonary/Chest: Effort normal and breath sounds normal. No respiratory distress. She has no wheezes.  She has no rales. She exhibits no tenderness.  Abdominal: Soft. Bowel sounds are normal. She exhibits no distension and no mass. There is tenderness. There is no rebound and no guarding.  Mild epigastric tenderness - belching  Musculoskeletal: Normal range of motion. She exhibits no edema and no tenderness.  Lymphadenopathy:    She has no cervical adenopathy.  Neurological: She is alert and oriented to person, place, and time. She exhibits normal muscle tone. Coordination normal.  Skin: Skin is warm and dry. No rash noted. No erythema. No pallor.  Small pustule noted to left medial knee - no erythema, no streaking, no fluctuance  Psychiatric: She has a normal mood and affect. Her behavior is normal. Judgment and thought content normal.    ED Course  Procedures (including critical care time) Labs Review Labs Reviewed  CBC WITH DIFFERENTIAL  COMPREHENSIVE METABOLIC PANEL  LIPASE, BLOOD  POCT PREGNANCY, URINE   Imaging Review No results found.  EKG Interpretation   None      Results for orders placed during the hospital encounter of 06/27/13  CBC WITH DIFFERENTIAL      Result Value Range   WBC 5.2  4.0 - 10.5 K/uL   RBC 4.34  3.87 - 5.11 MIL/uL   Hemoglobin 12.9  12.0 - 15.0 g/dL   HCT 47.8  29.5 - 62.1 %   MCV 84.3  78.0 - 100.0 fL   MCH 29.7  26.0 - 34.0 pg   MCHC 35.2  30.0 - 36.0 g/dL   RDW 30.8  65.7 - 84.6 %   Platelets 263  150 - 400 K/uL   Neutrophils Relative % 72  43 - 77 %   Neutro Abs 3.7  1.7 - 7.7 K/uL   Lymphocytes Relative 21  12 - 46 %   Lymphs Abs 1.1  0.7 - 4.0 K/uL   Monocytes Relative 5  3 - 12 %   Monocytes Absolute 0.3  0.1 - 1.0 K/uL   Eosinophils Relative 2  0 - 5 %   Eosinophils Absolute 0.1  0.0 - 0.7 K/uL   Basophils Relative 0  0 - 1 %   Basophils Absolute 0.0  0.0 - 0.1 K/uL  COMPREHENSIVE METABOLIC PANEL      Result Value Range   Sodium 139  135 - 145 mEq/L   Potassium 3.8  3.5 - 5.1 mEq/L   Chloride 103  96 - 112 mEq/L   CO2 27  19  - 32 mEq/L   Glucose, Bld 91  70 - 99 mg/dL   BUN 6  6 - 23 mg/dL   Creatinine, Ser 9.62  0.50 - 1.10 mg/dL   Calcium 9.4  8.4 - 95.2 mg/dL   Total Protein 7.4  6.0 -  8.3 g/dL   Albumin 4.3  3.5 - 5.2 g/dL   AST 34  0 - 37 U/L   ALT 29  0 - 35 U/L   Alkaline Phosphatase 95  39 - 117 U/L   Total Bilirubin 0.4  0.3 - 1.2 mg/dL   GFR calc non Af Amer >90  >90 mL/min   GFR calc Af Amer >90  >90 mL/min  LIPASE, BLOOD      Result Value Range   Lipase 33  11 - 59 U/L  POCT PREGNANCY, URINE      Result Value Range   Preg Test, Ur NEGATIVE  NEGATIVE   No results found.  1:42 PM Patient reports dulling of pain though it somewhat still exists.  She feels comfortable enough to go home.  MDM  Gastritis  Patient here with burning epigastric abdominal pain with radiation to into esophagus.  Patient reports relief of pain after GI cocktail.  Started on protonix.  Will discharge on this.  Labs and clinical exam unconcerning for acute abdominal emergency process.   Izola Price Marisue Humble, PA-C 06/27/13 1344

## 2013-06-30 NOTE — ED Provider Notes (Signed)
Medical screening examination/treatment/procedure(s) were performed by non-physician practitioner and as supervising physician I was immediately available for consultation/collaboration.  EKG Interpretation     Ventricular Rate:  89 PR Interval:  126 QRS Duration: 76 QT Interval:  364 QTC Calculation: 442 R Axis:   79 Text Interpretation:  Normal sinus rhythm with sinus arrhythmia No significant change since last tracing             Hurman Horn, MD 06/30/13 1840

## 2013-08-20 ENCOUNTER — Encounter (HOSPITAL_COMMUNITY): Payer: Self-pay | Admitting: Emergency Medicine

## 2013-08-20 ENCOUNTER — Emergency Department (HOSPITAL_COMMUNITY)
Admission: EM | Admit: 2013-08-20 | Discharge: 2013-08-20 | Disposition: A | Discharge status: Home / Self Care | Payer: Medicaid Other | Attending: Emergency Medicine | Admitting: Emergency Medicine

## 2013-08-20 DIAGNOSIS — Z79899 Other long term (current) drug therapy: Secondary | ICD-10-CM | POA: Insufficient documentation

## 2013-08-20 DIAGNOSIS — R197 Diarrhea, unspecified: Secondary | ICD-10-CM

## 2013-08-20 DIAGNOSIS — R630 Anorexia: Secondary | ICD-10-CM | POA: Insufficient documentation

## 2013-08-20 DIAGNOSIS — Z3202 Encounter for pregnancy test, result negative: Secondary | ICD-10-CM | POA: Insufficient documentation

## 2013-08-20 DIAGNOSIS — Z87891 Personal history of nicotine dependence: Secondary | ICD-10-CM | POA: Insufficient documentation

## 2013-08-20 DIAGNOSIS — R1110 Vomiting, unspecified: Secondary | ICD-10-CM

## 2013-08-20 DIAGNOSIS — J45909 Unspecified asthma, uncomplicated: Secondary | ICD-10-CM | POA: Insufficient documentation

## 2013-08-20 DIAGNOSIS — Z8719 Personal history of other diseases of the digestive system: Secondary | ICD-10-CM | POA: Insufficient documentation

## 2013-08-20 DIAGNOSIS — R1013 Epigastric pain: Secondary | ICD-10-CM | POA: Insufficient documentation

## 2013-08-20 DIAGNOSIS — Z87448 Personal history of other diseases of urinary system: Secondary | ICD-10-CM | POA: Insufficient documentation

## 2013-08-20 DIAGNOSIS — E860 Dehydration: Secondary | ICD-10-CM

## 2013-08-20 DIAGNOSIS — R1011 Right upper quadrant pain: Secondary | ICD-10-CM | POA: Insufficient documentation

## 2013-08-20 DIAGNOSIS — Z8742 Personal history of other diseases of the female genital tract: Secondary | ICD-10-CM | POA: Insufficient documentation

## 2013-08-20 HISTORY — DX: Gastritis, unspecified, without bleeding: K29.70

## 2013-08-20 HISTORY — DX: Noninfective gastroenteritis and colitis, unspecified: K52.9

## 2013-08-20 HISTORY — DX: Chronic rhinitis: J31.0

## 2013-08-20 LAB — COMPREHENSIVE METABOLIC PANEL
ALT: 27 U/L (ref 0–35)
Alkaline Phosphatase: 106 U/L (ref 39–117)
CO2: 23 mEq/L (ref 19–32)
Chloride: 102 mEq/L (ref 96–112)
GFR calc Af Amer: >90 mL/min (ref >90)
GFR calc non Af Amer: >90 mL/min (ref >90)
Glucose, Bld: 132 mg/dL — ABNORMAL HIGH (ref 70–99)
Potassium: 4 mEq/L (ref 3.5–5.1)
Sodium: 140 mEq/L (ref 135–145)
Total Bilirubin: 0.6 mg/dL (ref 0.3–1.2)

## 2013-08-20 LAB — LIPASE, BLOOD: Lipase: 24 U/L (ref 11–59)

## 2013-08-20 LAB — URINE MICROSCOPIC-ADD ON

## 2013-08-20 LAB — CBC WITH DIFFERENTIAL/PLATELET
Lymphocytes Relative: 2 % — ABNORMAL LOW (ref 12–46)
Lymphs Abs: 0.3 10*3/uL — ABNORMAL LOW (ref 0.7–4.0)
MCV: 84.1 fL (ref 78.0–100.0)
Neutro Abs: 12.9 10*3/uL — ABNORMAL HIGH (ref 1.7–7.7)
Neutrophils Relative %: 94 % — ABNORMAL HIGH (ref 43–77)
Platelets: 283 10*3/uL (ref 150–400)
RBC: 4.64 MIL/uL (ref 3.87–5.11)
WBC: 13.7 10*3/uL — ABNORMAL HIGH (ref 4.0–10.5)

## 2013-08-20 LAB — URINALYSIS, ROUTINE W REFLEX MICROSCOPIC
Bilirubin Urine: NEGATIVE
Hgb urine dipstick: NEGATIVE
Nitrite: NEGATIVE
Protein, ur: 30 mg/dL — AB
Specific Gravity, Urine: 1.027 (ref 1.005–1.030)
Urobilinogen, UA: 1 mg/dL (ref 0.0–1.0)

## 2013-08-20 MED ORDER — ONDANSETRON 4 MG PO TBDP
8.0000 mg | ORAL_TABLET | Freq: Once | ORAL | Status: AC
  Administered 2013-08-20: 8 mg via ORAL

## 2013-08-20 MED ORDER — SODIUM CHLORIDE 0.9 % IV BOLUS (SEPSIS)
500.0000 mL | Freq: Once | INTRAVENOUS | Status: AC
  Administered 2013-08-20: 500 mL via INTRAVENOUS

## 2013-08-20 MED ORDER — METOCLOPRAMIDE HCL 5 MG/ML IJ SOLN
10.0000 mg | Freq: Once | INTRAMUSCULAR | Status: AC
  Administered 2013-08-20: 10 mg via INTRAVENOUS
  Filled 2013-08-20: qty 2

## 2013-08-20 MED ORDER — METOCLOPRAMIDE HCL 10 MG PO TABS: 10.0000 mg | ORAL_TABLET | Freq: Four times a day (QID) | ORAL | Status: AC

## 2013-08-20 MED ORDER — ONDANSETRON HCL 4 MG/2ML IJ SOLN
4.0000 mg | Freq: Once | INTRAMUSCULAR | Status: AC
  Administered 2013-08-20: 4 mg via INTRAVENOUS
  Filled 2013-08-20: qty 2

## 2013-08-20 NOTE — ED Provider Notes (Signed)
CSN: [id number]     Arrival date & time 08/20/13  0149 History   First MD Initiated Contact with Patient 08/20/13 0234     Chief Complaint  Patient presents with  . Emesis  . Diarrhea   (Consider location/radiation/quality/duration/timing/severity/associated sxs/prior Treatment) HPI Comments: 32 yo female with epigastric pain hx presents with vomiting, diarrhea and abdominal cramping since yesterday evening, family with similar sxs recently.   No fevers.  No bleeding.  Intermittent.  Decr po intake  Patient is a 32 y.o. female presenting with vomiting and diarrhea. The history is provided by the patient.  Emesis Associated symptoms: abdominal pain and diarrhea   Associated symptoms: no chills and no headaches   Diarrhea Associated symptoms: abdominal pain and vomiting   Associated symptoms: no chills, no fever and no headaches     Past Medical History  Diagnosis Date  . Renal disorder   . Hydronephrosis   . Incompetent cervix   . Asthma   . Hypokalemia   . Gastritis   . Colitis   . Rhinitis    Past Surgical History  Procedure Laterality Date  . Btl    . Tubal ligation    . Cesarean section     No family history on file. History  Substance Use Topics  . Smoking status: Former Smoker    Quit date: 05/18/2008  . Smokeless tobacco: Not on file  . Alcohol Use: Yes     Comment: occasional   OB History   Grav Para Term Preterm Abortions TAB SAB Ect Mult Living                 Review of Systems  Constitutional: Positive for appetite change. Negative for fever and chills.  Respiratory: Negative for shortness of breath.   Cardiovascular: Negative for chest pain.  Gastrointestinal: Positive for vomiting, abdominal pain and diarrhea. Negative for blood in stool.  Genitourinary: Negative for dysuria and flank pain.  Musculoskeletal: Negative for back pain, neck pain and neck stiffness.  Skin: Negative for rash.  Neurological: Negative for light-headedness and headaches.     Allergies  Aspirin  Home Medications   Current Outpatient Rx  Name  Route  Sig  Dispense  Refill  . loratadine (CLARITIN) 10 MG tablet   Oral   Take 10 mg by mouth daily as needed for allergies.         . pantoprazole (PROTONIX) 20 MG tablet   Oral   Take 1 tablet (20 mg total) by mouth daily.   30 tablet   0    BP 112/95  Pulse 85  Temp(Src) 97.2 F (36.2 C) (Oral)  Resp 16  Wt 145 lb 4 oz (65.885 kg)  SpO2 100%  LMP 08/05/2013 Physical Exam  Nursing note and vitals reviewed. Constitutional: She is oriented to person, place, and time. She appears well-developed and well-nourished.  HENT:  Head: Normocephalic and atraumatic.  Mild dry mm  Eyes: Conjunctivae are normal. Right eye exhibits no discharge. Left eye exhibits no discharge.  Neck: Normal range of motion. Neck supple. No tracheal deviation present.  Cardiovascular: Normal rate and regular rhythm.   Pulmonary/Chest: Effort normal and breath sounds normal.  Abdominal: Soft. She exhibits no distension. There is tenderness (mild RUQ and epig). There is no guarding.  Musculoskeletal: She exhibits no edema.  Neurological: She is alert and oriented to person, place, and time.  Skin: Skin is warm. No rash noted.  Psychiatric: She has a normal mood and affect.  ED Course  Procedures (including critical care time)  EMERGENCY DEPARTMENT BILIARY ULTRASOUND INTERPRETATION "Study: Limited Abdominal Ultrasound of the gallbladder and common bile duct."  INDICATIONS: RUQ pain, Nausea and Back pain Indication: Multiple views of the gallbladder and common bile duct were obtained in real-time with a Multi-frequency probe." PERFORMED BY:  Myself IMAGES ARCHIVED?: Yes FINDINGS: Gallstones absent, Gallbladder wall normal in thickness, Sonographic Murphy's sign absent and Common bile duct normal in size LIMITATIONS: Body Habitus pain INTERPRETATION: Normal   Labs Review Labs Reviewed  CBC WITH DIFFERENTIAL -  Abnormal; Notable for the following:    WBC 13.7 (*)    Neutrophils Relative % 94 (*)    Neutro Abs 12.9 (*)    Lymphocytes Relative 2 (*)    Lymphs Abs 0.3 (*)    All other components within normal limits  COMPREHENSIVE METABOLIC PANEL - Abnormal; Notable for the following:    Glucose, Bld 132 (*)    All other components within normal limits  URINALYSIS, ROUTINE W REFLEX MICROSCOPIC - Abnormal; Notable for the following:    APPearance CLOUDY (*)    Ketones, ur 15 (*)    Protein, ur 30 (*)    All other components within normal limits  URINE MICROSCOPIC-ADD ON - Abnormal; Notable for the following:    Squamous Epithelial / LPF MANY (*)    Bacteria, UA MANY (*)    All other components within normal limits  LIPASE, BLOOD  POCT PREGNANCY, URINE   Imaging Review No results found.  EKG Interpretation   None       MDM   1. Vomiting   2. Diarrhea   3. Dehydration    Clinically GE.  With mild RUQ pain will check LFT and do bedside US.  Well appearing. Fluids and antiemetics in ED given.   Pt improved in ED, benign abdo exam, mostly epig and RUQ.  Results and differential diagnosis were discussed with the patient. Close follow up outpatient was discussed, patient comfortable with the plan.   Diagnosis: above      Enid Skeens, MD 08/20/13 [phone]

## 2013-08-20 NOTE — ED Notes (Signed)
Continuous vomiting/ dry heaving.

## 2013-08-20 NOTE — ED Notes (Signed)
Pt. reports nausea , vomitting and diarrhea with generalized abdominal cramping onset 6 pm this evening , denies urinary discomfort or fever .

## 2013-11-30 ENCOUNTER — Emergency Department (INDEPENDENT_AMBULATORY_CARE_PROVIDER_SITE_OTHER)
Admission: EM | Admit: 2013-11-30 | Discharge: 2013-11-30 | Disposition: A | Discharge status: Home / Self Care | Payer: Self-pay | Source: Home / Self Care

## 2013-11-30 ENCOUNTER — Encounter (HOSPITAL_COMMUNITY): Payer: Self-pay | Admitting: Emergency Medicine

## 2013-11-30 DIAGNOSIS — L0231 Cutaneous abscess of buttock: Secondary | ICD-10-CM

## 2013-11-30 DIAGNOSIS — L03317 Cellulitis of buttock: Secondary | ICD-10-CM

## 2013-11-30 HISTORY — DX: Gastro-esophageal reflux disease without esophagitis: K21.9

## 2013-11-30 MED ORDER — DOXYCYCLINE HYCLATE 100 MG PO CAPS: 100.0000 mg | ORAL_CAPSULE | Freq: Two times a day (BID) | ORAL | Status: AC

## 2013-11-30 NOTE — ED Provider Notes (Signed)
CSN: [id number]     Arrival date & time 11/30/13  7829 History   None    Chief Complaint  Patient presents with  . Abscess   (Consider location/radiation/quality/duration/timing/severity/associated sxs/prior Treatment) Patient is a 33 y.o. female presenting with abscess. The history is provided by the patient.  Abscess Location:  Ano-genital Ano-genital abscess location:  R buttock Abscess quality: induration, painful, redness and warmth   Abscess quality: not draining, no fluctuance, no itching and not weeping   Red streaking: no   Duration:  4 days Progression:  Worsening Pain details:    Quality:  Throbbing Context: not diabetes, not immunosuppression, not injected drug use, not insect bite/sting and not skin injury   Associated symptoms: nausea   Risk factors: family hx of MRSA and prior abscess     Past Medical History  Diagnosis Date  . Renal disorder   . Hydronephrosis   . Incompetent cervix   . Asthma   . Hypokalemia   . Gastritis   . Colitis   . Rhinitis   . GERD (gastroesophageal reflux disease)    Past Surgical History  Procedure Laterality Date  . Btl    . Tubal ligation    . Cesarean section    . Cervical cerclage      x2   No family history on file. History  Substance Use Topics  . Smoking status: Former Smoker    Quit date: 05/18/2008  . Smokeless tobacco: Not on file  . Alcohol Use: No   OB History   Grav Para Term Preterm Abortions TAB SAB Ect Mult Living                 Review of Systems  Gastrointestinal: Positive for nausea.  All other systems reviewed and are negative.    Allergies  Aspirin  Home Medications   Current Outpatient Rx  Name  Route  Sig  Dispense  Refill  . omeprazole (PRILOSEC) 40 MG capsule   Oral   Take 40 mg by mouth daily.         Marland Kitchen doxycycline (VIBRAMYCIN) 100 MG capsule   Oral   Take 1 capsule (100 mg total) by mouth 2 (two) times daily.   20 capsule   0   . loratadine (CLARITIN) 10 MG tablet  Oral   Take 10 mg by mouth daily as needed for allergies.         Marland Kitchen metoCLOPramide (REGLAN) 10 MG tablet   Oral   Take 1 tablet (10 mg total) by mouth every 6 (six) hours.   8 tablet   0   . pantoprazole (PROTONIX) 20 MG tablet   Oral   Take 1 tablet (20 mg total) by mouth daily.   30 tablet   0    BP 132/89  Pulse 124  Temp(Src) 98.4 F (36.9 C) (Oral)  Resp 18  SpO2 99%  LMP 11/05/2013 Physical Exam  Constitutional: She is oriented to person, place, and time. She appears well-developed and well-nourished. No distress.  HENT:  Head: Normocephalic and atraumatic.  Eyes: Conjunctivae are normal.  Cardiovascular: Regular rhythm and normal heart sounds.   +mild tachycardia  Pulmonary/Chest: Effort normal.  Musculoskeletal: Normal range of motion.  Neurological: She is alert and oriented to person, place, and time.  Skin: Skin is warm and dry.  4x5 cm oval shaped area of induration, erythema and tenderness without palpable fluctuance or active drainage. Borders marked with surgical pen.   Psychiatric: She has a  normal mood and affect. Her behavior is normal.    ED Course  Procedures (including critical care time) Labs Review Labs Reviewed - No data to display Imaging Review No results found.   MDM   1. Cellulitis of buttock, right   warm compresses QID, doxycycline as prescribed, [facility]    Jess BartersJennifer Lee Braz[place], [place] 11/30/13 [phone]

## 2013-11-30 NOTE — ED Provider Notes (Signed)
Medical screening examination/treatment/procedure(s) were performed by resident physician or non-physician practitioner and as supervising physician I was immediately available for consultation/collaboration.   Dream Nodal DOUGLAS MD.   Stacie Templin D Ruthmary Occhipinti, MD 11/30/13 1207 

## 2013-11-30 NOTE — ED Notes (Signed)
C/O circular area redness and swelling to right buttock x 4 days with progressive worsening.  C/O nausea, denies fevers.  Has been taking hot baths and took Zofran without relief.

## 2013-11-30 NOTE — Discharge Instructions (Signed)
Cellulitis Cellulitis is an infection of the skin and the tissue beneath it. The infected area is usually red and tender. Cellulitis occurs most often in the arms and lower legs.  CAUSES  Cellulitis is caused by bacteria that enter the skin through cracks or cuts in the skin. The most common types of bacteria that cause cellulitis are Staphylococcus and Streptococcus. SYMPTOMS   Redness and warmth.  Swelling.  Tenderness or pain.  Fever. DIAGNOSIS  Your caregiver can usually determine what is wrong based on a physical exam. Blood tests may also be done. TREATMENT  Treatment usually involves taking an antibiotic medicine. HOME CARE INSTRUCTIONS   Take your antibiotics as directed. Finish them even if you start to feel better.  Keep the infected arm or leg elevated to reduce swelling.  Apply a warm cloth to the affected area up to 4 times per day to relieve pain.  Only take over-the-counter or prescription medicines for pain, discomfort, or fever as directed by your caregiver.  Keep all follow-up appointments as directed by your caregiver. SEEK MEDICAL CARE IF:   You notice red streaks coming from the infected area.  Your red area gets larger or turns dark in color.  Your bone or joint underneath the infected area becomes painful after the skin has healed.  Your infection returns in the same area or another area.  You notice a swollen bump in the infected area.  You develop new symptoms. SEEK IMMEDIATE MEDICAL CARE IF:   You have a fever.  You feel very sleepy.  You develop vomiting or diarrhea.  You have a general ill feeling (malaise) with muscle aches and pains. MAKE SURE YOU:   Understand these instructions.  Will watch your condition.  Will get help right away if you are not doing well or get worse. Document Released: 05/25/2005 Document Revised: 02/14/2012 Document Reviewed: 10/31/2011 ExitCare Patient Information 2014 [employer].  

## 2013-12-02 ENCOUNTER — Emergency Department (INDEPENDENT_AMBULATORY_CARE_PROVIDER_SITE_OTHER)
Admission: EM | Admit: 2013-12-02 | Discharge: 2013-12-02 | Disposition: A | Discharge status: Home / Self Care | Payer: Self-pay | Source: Home / Self Care

## 2013-12-02 ENCOUNTER — Encounter (HOSPITAL_COMMUNITY): Payer: Self-pay | Admitting: Emergency Medicine

## 2013-12-02 DIAGNOSIS — L0231 Cutaneous abscess of buttock: Secondary | ICD-10-CM

## 2013-12-02 DIAGNOSIS — L03317 Cellulitis of buttock: Secondary | ICD-10-CM

## 2013-12-02 MED ORDER — HYDROCODONE-ACETAMINOPHEN 5-325 MG PO TABS: ORAL_TABLET | ORAL | Status: AC

## 2013-12-02 NOTE — Discharge Instructions (Signed)
Abscess An abscess is an infected area that contains a collection of pus and debris.It can occur in almost any part of the body. An abscess is also known as a furuncle or boil. CAUSES  An abscess occurs when tissue gets infected. This can occur from blockage of oil or sweat glands, infection of hair follicles, or a minor injury to the skin. As the body tries to fight the infection, pus collects in the area and creates pressure under the skin. This pressure causes pain. People with weakened immune systems have difficulty fighting infections and get certain abscesses more often.  SYMPTOMS Usually an abscess develops on the skin and becomes a painful mass that is red, warm, and tender. If the abscess forms under the skin, you may feel a moveable soft area under the skin. Some abscesses break open (rupture) on their own, but most will continue to get worse without care. The infection can spread deeper into the body and eventually into the bloodstream, causing you to feel ill.  DIAGNOSIS  Your caregiver will take your medical history and perform a physical exam. A sample of fluid may also be taken from the abscess to determine what is causing your infection. TREATMENT  Your caregiver may prescribe antibiotic medicines to fight the infection. However, taking antibiotics alone usually does not cure an abscess. Your caregiver may need to make a small cut (incision) in the abscess to drain the pus. In some cases, gauze is packed into the abscess to reduce pain and to continue draining the area. HOME CARE INSTRUCTIONS   Only take over-the-counter or prescription medicines for pain, discomfort, or fever as directed by your caregiver.  If you were prescribed antibiotics, take them as directed. Finish them even if you start to feel better.  If gauze is used, follow your caregiver's directions for changing the gauze.  To avoid spreading the infection:  Keep your draining abscess covered with a  bandage.  Wash your hands well.  Do not share personal care items, towels, or whirlpools with others.  Avoid skin contact with others.  Keep your skin and clothes clean around the abscess.  Keep all follow-up appointments as directed by your caregiver. SEEK MEDICAL CARE IF:   You have increased pain, swelling, redness, fluid drainage, or bleeding.  You have muscle aches, chills, or a general ill feeling.  You have a fever. MAKE SURE YOU:   Understand these instructions.  Will watch your condition.  Will get help right away if you are not doing well or get worse. Document Released: 05/25/2005 Document Revised: 02/14/2012 Document Reviewed: 10/28/2011 ExitCare Patient Information 2014 [employer].  

## 2013-12-02 NOTE — ED Notes (Signed)
Follow up on right side cellulitis  States she feels worst Area is spreading outside the pcp marker drawing States she double up on antibiotics Has been nausea

## 2013-12-02 NOTE — ED Provider Notes (Signed)
CSN: [id number]     Arrival date & time 12/02/13  0801 History   None    Chief Complaint  Patient presents with  . Cellulitis   (Consider location/radiation/quality/duration/timing/severity/associated sxs/prior Treatment) HPI Comments: 33 year old female presents for wound check status post conservative treatment of an area of cellulitis and induration to the right outer quadrant of the right buttock. The inked margins of the area of erythema demonstrates partial resolution on distal end but exteding cephalad by 2.5 cm. Continues to be very painful and pt taking ABX and using warm compresses   Past Medical History  Diagnosis Date  . Renal disorder   . Hydronephrosis   . Incompetent cervix   . Asthma   . Hypokalemia   . Gastritis   . Colitis   . Rhinitis   . GERD (gastroesophageal reflux disease)    Past Surgical History  Procedure Laterality Date  . Btl    . Tubal ligation    . Cesarean section    . Cervical cerclage      x2   History reviewed. No pertinent family history. History  Substance Use Topics  . Smoking status: Former Smoker    Quit date: 05/18/2008  . Smokeless tobacco: Not on file  . Alcohol Use: No   OB History   Grav Para Term Preterm Abortions TAB SAB Ect Mult Living                 Review of Systems  All other systems reviewed and are negative.    Allergies  Aspirin  Home Medications   Current Outpatient Rx  Name  Route  Sig  Dispense  Refill  . doxycycline (VIBRAMYCIN) 100 MG capsule   Oral   Take 1 capsule (100 mg total) by mouth 2 (two) times daily.   20 capsule   0   . HYDROcodone-acetaminophen (NORCO/VICODIN) 5-325 MG per tablet      1/2 to 1 tab q 4h prn pain   15 tablet   0   . loratadine (CLARITIN) 10 MG tablet   Oral   Take 10 mg by mouth daily as needed for allergies.         Marland Kitchen. metoCLOPramide (REGLAN) 10 MG tablet   Oral   Take 1 tablet (10 mg total) by mouth every 6 (six) hours.   8 tablet   0   . omeprazole  (PRILOSEC) 40 MG capsule   Oral   Take 40 mg by mouth daily.         . pantoprazole (PROTONIX) 20 MG tablet   Oral   Take 1 tablet (20 mg total) by mouth daily.   30 tablet   0    BP 142/92  Pulse 100  Temp(Src) 98.1 F (36.7 C) (Oral)  Resp 18  SpO2 100%  LMP 11/05/2013 Physical Exam  Nursing note and vitals reviewed. Constitutional: She is oriented to person, place, and time. She appears well-developed and well-nourished. No distress.  No fever, chills or other systemic sx's.  Cardiovascular: Normal rate.   Musculoskeletal: Normal range of motion. She exhibits no edema.  Neurological: She is alert and oriented to person, place, and time.  Skin: Skin is warm and dry.  Area of cellulitis over the area of induration a litle larger. Remains very tender and painful.  Located right buttock upper outer quadrant.   Psychiatric: She has a normal mood and affect.    ED Course  INCISION AND DRAINAGE Date/Time: 12/02/2013 8:43 AM Performed by:  Lori-Ann Lindfors Authorized by: Lorenz Coaster, Roda Lauture C Consent: Verbal consent obtained. Risks and benefits: risks, benefits and alternatives were discussed Consent given by: patient Patient understanding: patient states understanding of the procedure being performed Patient identity confirmed: verbally with patient Type: abscess Body area: lower extremity Location details: right buttock Anesthesia: local infiltration Local anesthetic: lidocaine 2% with epinephrine Anesthetic total: 10 ml Patient sedated: no Scalpel size: 11 Incision type: single with marsupialization Complexity: complex Drainage: purulent and  bloody Drainage amount: moderate Wound treatment: wound left open Packing material: 1/4 in gauze Patient tolerance: Patient tolerated the procedure well with no immediate complications. Comments: Large cavity existed . Loculation bluntly dissected and releasing purulence and blood   (including critical care time) Labs Review Labs  Reviewed  CULTURE, ROUTINE-ABSCESS   Imaging Review No results found.   MDM   1. Abscess of buttock, right      I and D instrucions to RTO 2 d, packing removal and rechk. Culture pending.    Hayden Rasmussen, NP 12/02/13 [id number]  Hayden Rasmussen, NP 12/02/13 [phone]

## 2013-12-02 NOTE — ED Provider Notes (Signed)
Medical screening examination/treatment/procedure(s) were performed by non-physician practitioner and as supervising physician I was immediately available for consultation/collaboration.  Leslee Homeavid Tamantha Saline, M.D.   Reuben Likesavid C An Schnabel, MD 12/02/13 [phone]

## 2013-12-04 ENCOUNTER — Encounter (HOSPITAL_COMMUNITY): Payer: Self-pay | Admitting: Emergency Medicine

## 2013-12-04 ENCOUNTER — Emergency Department (INDEPENDENT_AMBULATORY_CARE_PROVIDER_SITE_OTHER)
Admission: EM | Admit: 2013-12-04 | Discharge: 2013-12-04 | Disposition: A | Discharge status: Home / Self Care | Payer: Self-pay | Source: Home / Self Care

## 2013-12-04 DIAGNOSIS — Z4889 Encounter for other specified surgical aftercare: Secondary | ICD-10-CM

## 2013-12-04 DIAGNOSIS — Z4800 Encounter for change or removal of nonsurgical wound dressing: Secondary | ICD-10-CM

## 2013-12-04 LAB — CULTURE, ROUTINE-ABSCESS

## 2013-12-04 NOTE — Discharge Instructions (Signed)
Incision Care  An incision is a surgical cut to open your skin. You need to take care of your incision. This helps you to not get an infection. HOME CARE  Only take medicine as told by your doctor.  Do not take off your bandage (dressing) or get your incision wet until your doctor approves. Change the bandage and call your doctor if the bandage gets wet, dirty, or starts to smell.  Take showers. Do not take baths, swim, or do anything that may soak your incision until it heals.  Return to your normal diet and activities as told or allowed by your doctor.  Avoid lifting any weight until your doctor approves.  Put medicine that helps lessen itching on your incision as told by your doctor. Do not pick or scratch at your incision.  Keep your doctor visit to have your stitches (sutures) or staples removed.  Drink enough fluids to keep your pee (urine) clear or pale yellow. GET HELP RIGHT AWAY IF:  You have a fever.  You have a rash.  You are dizzy, or you pass out (faint) while standing.  You have trouble breathing.  You have a reaction or side effects to medicine given to you.  You have redness, puffiness (swelling), or more pain in the incision and medicine does not help.  You have fluid, blood, or yellowish-white fluid (pus) coming from the incision lasting over 1 day.  You have muscle aches, chills, or you feel sick.  You have a bad smell coming from the incision or bandage.  Your incision opens up after stitches, staples, or sticky strips have been removed.  You keep feeling sick to your stomach (nauseous) or keep throwing up (vomiting). MAKE SURE YOU:   Understand these instructions.  Will watch your condition.  Will get help right away if you are not doing well or get worse. Document Released: 11/07/2011 Document Reviewed: 11/07/2011 [facility] Patient Information 2014 [employer], [place].  Wound Care Wound care helps prevent pain and infection.  You may need a  tetanus shot if:  You cannot remember when you had your last tetanus shot.  You have never had a tetanus shot.  The injury broke your skin. If you need a tetanus shot and you choose not to have one, you may get tetanus. Sickness from tetanus can be serious. HOME CARE   Only take medicine as told by your doctor.  Clean the wound daily with mild soap and water.  Change any bandages (dressings) as told by your doctor.  Put medicated cream and a bandage on the wound as told by your doctor.  Change the bandage if it gets wet, dirty, or starts to smell.  Take showers. Do not take baths, swim, or do anything that puts your wound under water.  Rest and raise (elevate) the wound until the pain and puffiness (swelling) are better.  Keep all doctor visits as told. GET HELP RIGHT AWAY IF:   Yellowish-white fluid (pus) comes from the wound.  Medicine does not lessen your pain.  There is a red streak going away from the wound.  You have a fever. MAKE SURE YOU:   Understand these instructions.  Will watch your condition.  Will get help right away if you are not doing well or get worse. Document Released: 05/24/2008 Document Revised: 11/07/2011 Document Reviewed: 12/19/2010 [facility] Patient Information 2014 [place], [place].

## 2013-12-04 NOTE — ED Notes (Signed)
Pt is here for a f/u on abscess/cellulitis; right buttocks Still feeling nauseas but no sure if its due to meds Alert w/no signs of acute distress.

## 2013-12-04 NOTE — ED Provider Notes (Signed)
CSN: [id number]     Arrival date & time 12/04/13  3086 History   First MD Initiated Contact with Patient 12/04/13 [phone]     Chief Complaint  Patient presents with  . Follow-up    abscess/cellulitis   (Consider location/radiation/quality/duration/timing/severity/associated sxs/prior Treatment) HPI Comments: For wound chk and packing removal 2 d s/p I and D of abscess. Pt st is feeling 100% better   Past Medical History  Diagnosis Date  . Renal disorder   . Hydronephrosis   . Incompetent cervix   . Asthma   . Hypokalemia   . Gastritis   . Colitis   . Rhinitis   . GERD (gastroesophageal reflux disease)    Past Surgical History  Procedure Laterality Date  . Btl    . Tubal ligation    . Cesarean section    . Cervical cerclage      x2   No family history on file. History  Substance Use Topics  . Smoking status: Former Smoker    Quit date: 05/18/2008  . Smokeless tobacco: Not on file  . Alcohol Use: No   OB History   Grav Para Term Preterm Abortions TAB SAB Ect Mult Living                 Review of Systems  Constitutional: Negative.   Skin: Positive for wound.  All other systems reviewed and are negative.   Allergies  Aspirin  Home Medications   Current Outpatient Rx  Name  Route  Sig  Dispense  Refill  . doxycycline (VIBRAMYCIN) 100 MG capsule   Oral   Take 1 capsule (100 mg total) by mouth 2 (two) times daily.   20 capsule   0   . omeprazole (PRILOSEC) 40 MG capsule   Oral   Take 40 mg by mouth daily.         Marland Kitchen HYDROcodone-acetaminophen (NORCO/VICODIN) 5-325 MG per tablet      1/2 to 1 tab q 4h prn pain   15 tablet   0   . loratadine (CLARITIN) 10 MG tablet   Oral   Take 10 mg by mouth daily as needed for allergies.         Marland Kitchen metoCLOPramide (REGLAN) 10 MG tablet   Oral   Take 1 tablet (10 mg total) by mouth every 6 (six) hours.   8 tablet   0   . pantoprazole (PROTONIX) 20 MG tablet   Oral   Take 1 tablet (20 mg total) by mouth  daily.   30 tablet   0    BP 128/83  Pulse 100  Temp(Src) 97.9 F (36.6 C) (Oral)  Resp 18  SpO2 100%  LMP 11/05/2013 Physical Exam  Nursing note and vitals reviewed. Constitutional: She is oriented to person, place, and time.  Neurological: She is alert and oriented to person, place, and time. She exhibits normal muscle tone.  Skin: Skin is warm and dry. No erythema.  No current drainage. All erythema has disappeared. Mild local tenderness only.Healing well.  Psychiatric: She has a normal mood and affect.    ED Course  Procedures (including critical care time) Labs Review Labs Reviewed - No data to display Imaging Review No results found.   MDM   1. Encounter for postoperative wound check     Packing removed Healing nicely, no erythema or drainage. Placed bandaid on incision May shower but express any water from wound afterwards/ Finish ABX Return for problems    Onalee Hua  Byran Bilotti, NP 12/04/13 [phone]

## 2013-12-04 NOTE — ED Notes (Addendum)
Abscess culture R buttocks: Mod. Staph. Aureus.  Pt. adequately treated with Doxycycline on 4/4 and I and D on 4/6. Desiree LucySuzanne M [facility] 12/04/2013

## 2013-12-05 NOTE — ED Provider Notes (Signed)
Medical screening examination/treatment/procedure(s) were performed by non-physician practitioner and as supervising physician I was immediately available for consultation/collaboration.  Hanin Decook, M.D.  Joniah Bednarski C Myria Steenbergen, MD 12/05/13 0751
